# Patient Record
Sex: Male | Born: 2000 | Race: White | Hispanic: No | Marital: Single | Smoking: Never smoker
Health system: Southern US, Community
[De-identification: ages and names within clinical notes are randomized; demographics above are authoritative.]

## PROBLEM LIST (undated history)

## (undated) DIAGNOSIS — S62109A Fracture of unspecified carpal bone, unspecified wrist, initial encounter for closed fracture: Secondary | ICD-10-CM

## (undated) HISTORY — PX: APPENDECTOMY: SHX54

---
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
## 2020-02-04 IMAGING — CT CT ABD-PELV W/ CM
2 of 4 series · 15 of 46 positions shown, 17 images · IV contrast (APPLIED)
Comparison: None.

CLINICAL DATA: Mid abdominal pain since last evening

EXAM:
CT ABDOMEN AND PELVIS WITH CONTRAST
TECHNIQUE: Multidetector CT imaging of the abdomen and pelvis was performed
using the standard protocol following bolus administration of
intravenous contrast.
CONTRAST:  100mL [id number] IOPAMIDOL ([id number]) INJECTION 61%

[Series 2: axial st · axial · 0.56mm/px · z∈[-614,-194]mm · 12 of 96 slices shown, 14 images]
[im 8/96  soft-tissue]
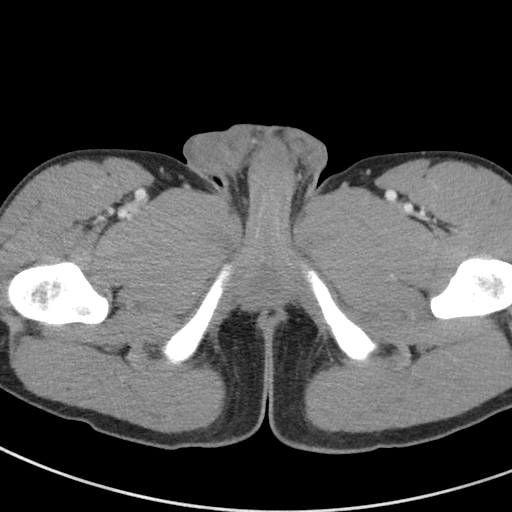
[im 8/96  bone]
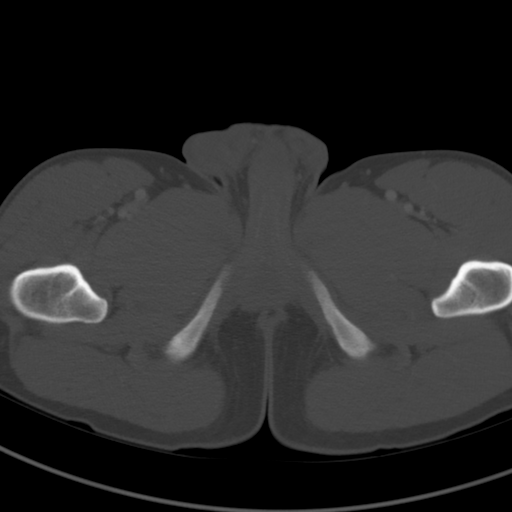
[im 16/96  soft-tissue]
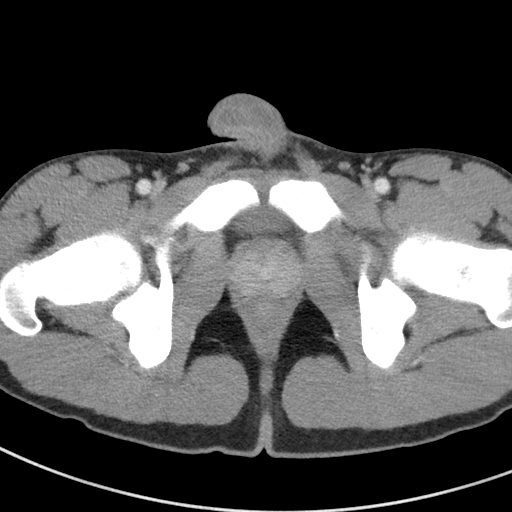
[im 23/96  soft-tissue]
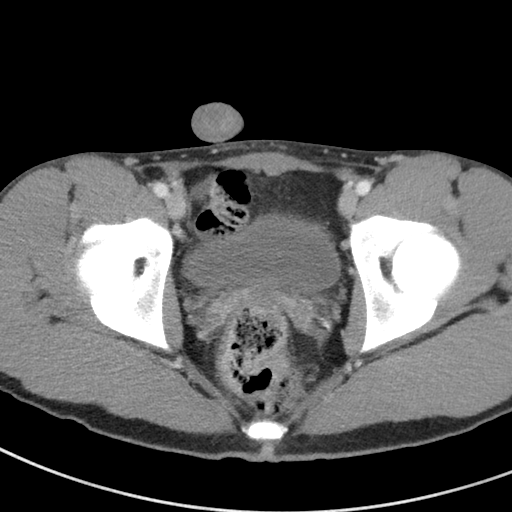
[im 31/96  soft-tissue]
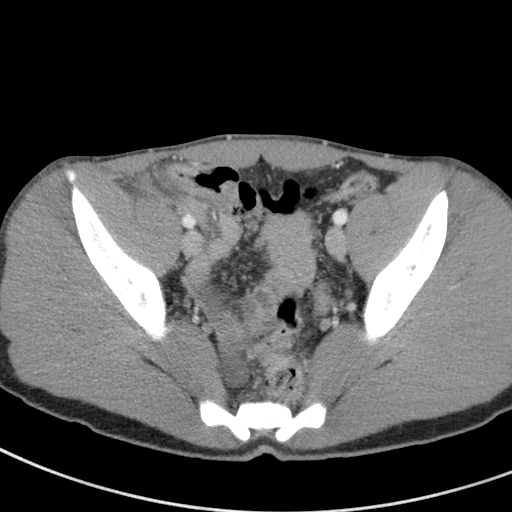
[im 39/96  soft-tissue]
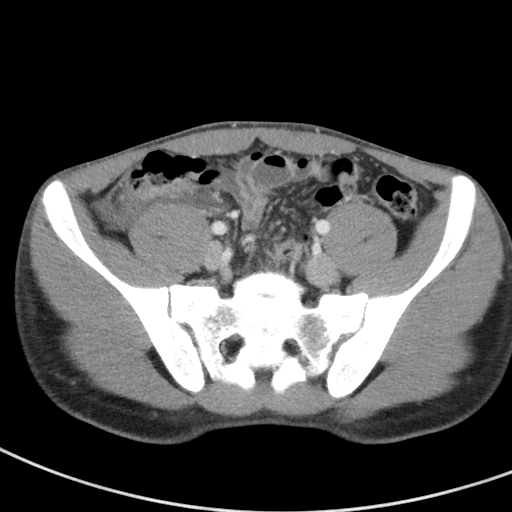
[im 46/96  soft-tissue]
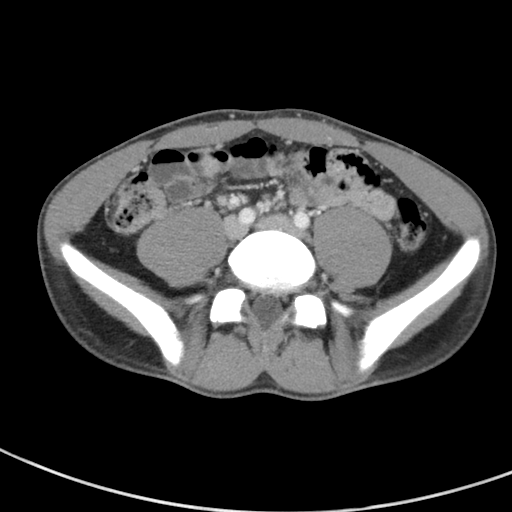
[im 54/96  soft-tissue]
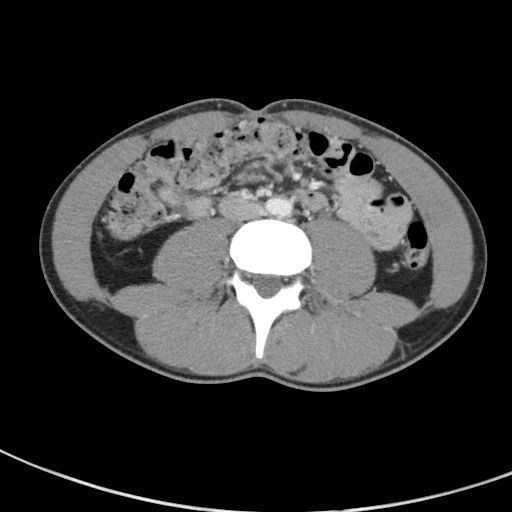
[im 61/96  soft-tissue]
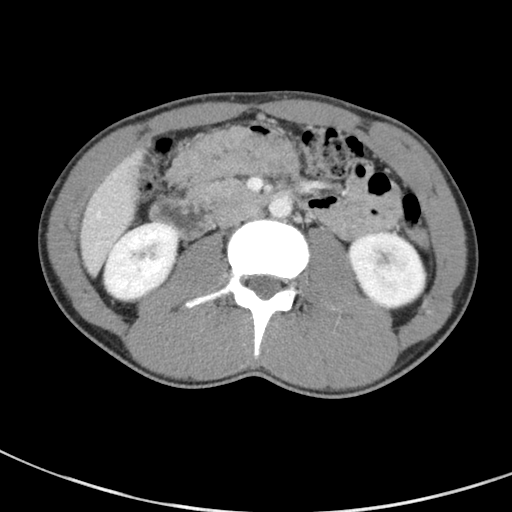
[im 69/96  soft-tissue]
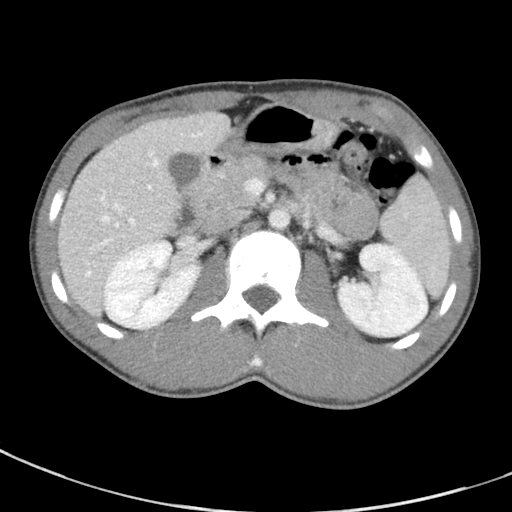
[im 69/96  bone]
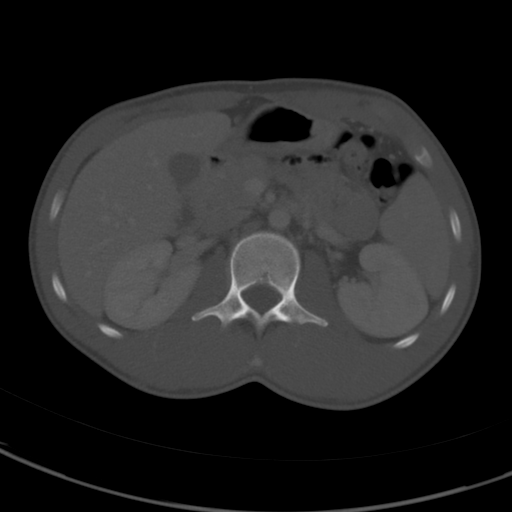
[im 77/96  soft-tissue]
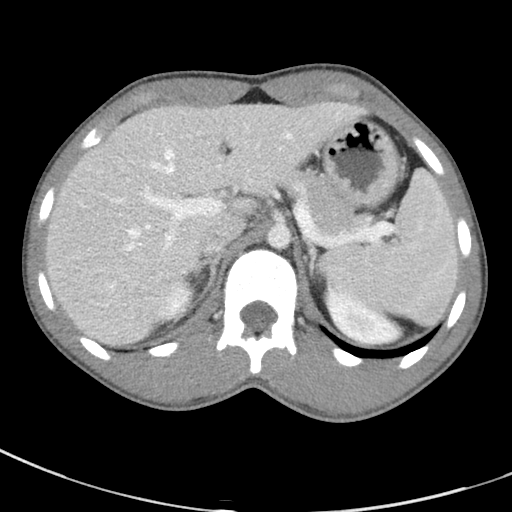
[im 84/96  soft-tissue]
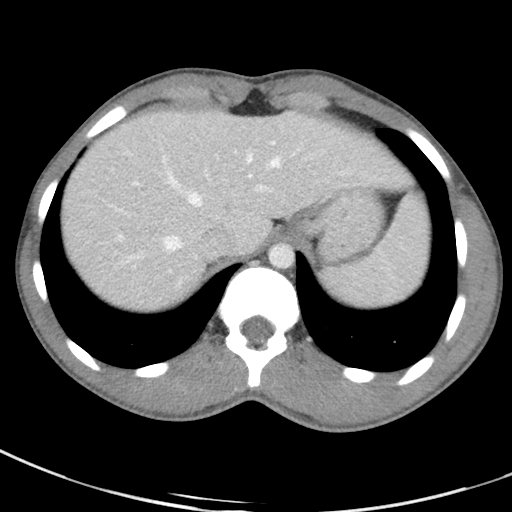
[im 92/96  soft-tissue]
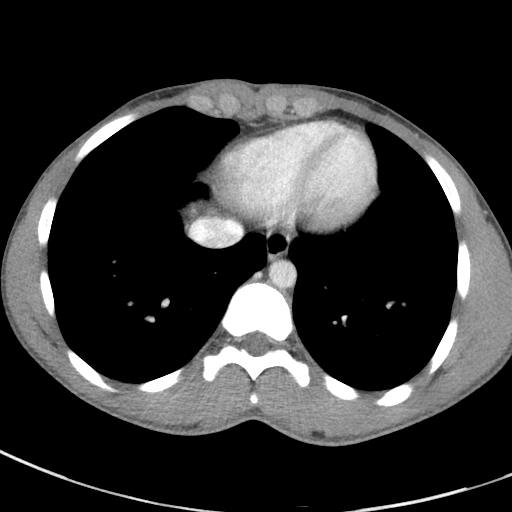

[Series 5: coronal st · coronal · 0.70mm/px · 3 of 73 slices shown]
[im 25/73  soft-tissue]
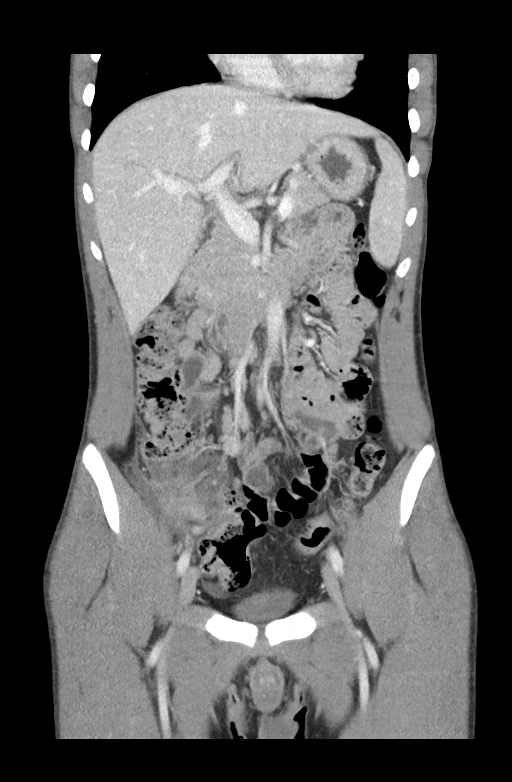
[im 33/73  soft-tissue]
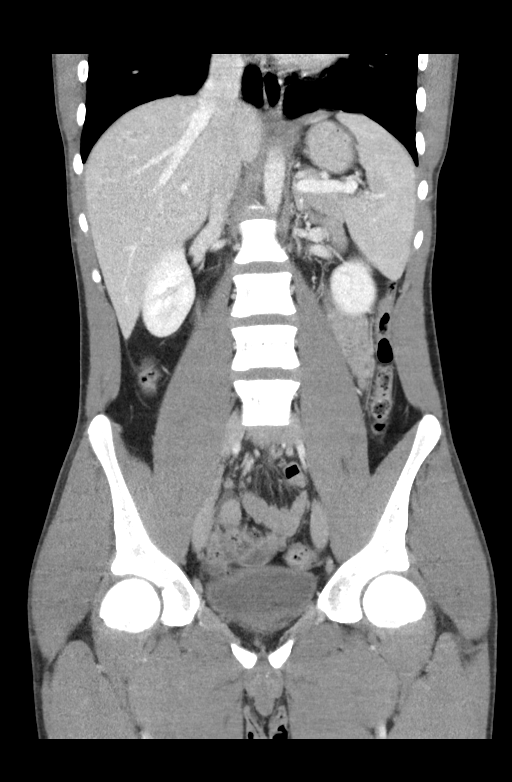
[im 41/73  soft-tissue]
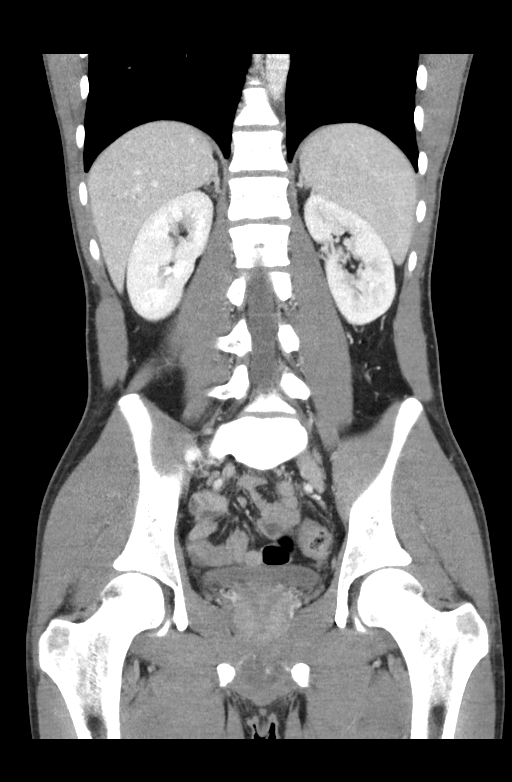

[15 of 46 positions shown; findings below may reference images not displayed]

FINDINGS: Lower chest: No acute abnormality.

Hepatobiliary: No focal liver abnormality is seen. No gallstones,
gallbladder wall thickening, or biliary dilatation.

Pancreas: Unremarkable. No pancreatic ductal dilatation or
surrounding inflammatory changes.

Spleen: Normal in size without focal abnormality.

Adrenals/Urinary Tract: Adrenal glands are unremarkable. Kidneys are
normal, without renal calculi, focal lesion, or hydronephrosis.
Bladder is unremarkable.

Stomach/Bowel: Abnormal appearance of the appendix which is as
follows:

Appendix: Location: McBurney's point

Diameter: 13 mm

Appendicolith: 10 x 4 mm, series [DATE]

Mucosal hyper-enhancement: Present

Extraluminal gas: None

Periappendiceal collection: Small amount of periappendiceal fluid
and mild to moderate periappendiceal inflammation without drainable
abscess

The remainder of the small large bowel are unremarkable.

Vascular/Lymphatic: No significant vascular findings are present. No
enlarged abdominal or pelvic lymph nodes.

Reproductive: Normal size prostate and seminal vesicles.

Other: Small amount of free fluid in the pelvis. No free air.

Musculoskeletal: No acute or significant osseous findings.
IMPRESSION: Acute appendicitis with periappendiceal inflammation, appendicolith
measuring 10 x 4 mm and small amount of free fluid in the right
lower quadrant and pelvis. No drainable abscess.

## 2021-06-03 ENCOUNTER — Other Ambulatory Visit: Payer: Self-pay

## 2021-06-03 ENCOUNTER — Emergency Department (HOSPITAL_BASED_OUTPATIENT_CLINIC_OR_DEPARTMENT_OTHER)
Admission: EM | Admit: 2021-06-03 | Discharge: 2021-06-03 | Disposition: A | Discharge status: Home / Self Care | Payer: Medicaid Other | Attending: Emergency Medicine | Admitting: Emergency Medicine

## 2021-06-03 ENCOUNTER — Encounter (HOSPITAL_BASED_OUTPATIENT_CLINIC_OR_DEPARTMENT_OTHER): Payer: Self-pay | Admitting: Emergency Medicine

## 2021-06-03 DIAGNOSIS — J029 Acute pharyngitis, unspecified: Secondary | ICD-10-CM | POA: Diagnosis present

## 2021-06-03 DIAGNOSIS — U071 COVID-19: Secondary | ICD-10-CM | POA: Diagnosis not present

## 2021-06-03 LAB — GROUP A STREP BY PCR: Group A Strep by PCR: NOT DETECTED

## 2021-06-03 NOTE — ED Triage Notes (Signed)
Pt arrives pov with c/o sore throat, generalized body aches, congestion and blood streaked mucus with vomitus last night. Pt denies abdominal pain. Pt endorses chills, unknown temp at home. Pt states "I feel like this every time I sleep under a fan, but Ive never thew up".

## 2021-06-03 NOTE — Discharge Instructions (Addendum)
Your strep test was negative today. We have tested you for COVID given complaints of a sore throat - we will call you if the results come back positive however you can log into MyChart and check the results that way as well.   Please follow up with your PCP regarding ED visit today.  Return to the ED for any new/worsening symptoms including persistent vomiting, black tarry stools, bright red blood in stools, abdominal pain, or any other new/worsening symptoms.

## 2021-06-03 NOTE — ED Provider Notes (Signed)
[facility] EMERGENCY DEPARTMENT Provider Note   CSN: [id number] Arrival date & time: 06/03/21  1212     History Chief Complaint  Patient presents with   Sore Throat    Dean Ochoa is a 20 y.o. male who presents to the ED today with complaint of gradual onset, constant, achy, sore throat that began yesterday morning upon waking up. Pt reports that he slept with a standing fan in his room Saturday night. He states he woke up yesterday morning with a sore throat and body aches which he states will happen to him whenever he sleeps with a fan blowing on him at nighttime. He forgot to turn the fan off before he went to bed. He states that all day yesterday he felt "bad" however last night he went to bed and then woke up in the middle of the night feeling nauseated and then had 1 episode of emesis. Pt reports a mix of food he ate that night ([place] food) and a small amount of blood tinged sputum. He states that he told his mom about it today and she advised that he come to the ED for further evaluation. Pt has been able to eat and drink since that episode without additional emesis. He denies any abdominal pain. No diarrhea. No melena. He is not anticoagulated. No other complaints at this time.   The history is provided by the patient and medical records.      Past Medical History:  Diagnosis Date   Wrist fracture     Patient Active Problem List   Diagnosis Date Noted   Appendicitis 10/11/2018   Acute appendicitis, uncomplicated 10/11/2018   ADHD (attention deficit hyperactivity disorder) 05/29/2011    Past Surgical History:  Procedure Laterality Date   APPENDECTOMY     LAPAROSCOPIC APPENDECTOMY N/A 10/11/2018   Procedure: APPENDECTOMY LAPAROSCOPIC PEDIATRIC;  Surgeon: Kandice Hams, MD;  Location: [facility] OR;  Service: Pediatrics;  Laterality: N/A;       History reviewed. No pertinent family history.  Social History   Tobacco Use   Smoking status: Never    Passive  exposure: Yes   Smokeless tobacco: Never   Tobacco comments:    parents smoke outside the home  Vaping Use   Vaping Use: Every day  Substance Use Topics   Alcohol use: Not Currently   Drug use: Yes    Types: Marijuana    Home Medications Prior to Admission medications   Medication Sig Start Date End Date Taking? Authorizing Provider  ibuprofen (ADVIL,MOTRIN) 600 MG tablet Take 1 tablet (600 mg total) by mouth every 8 (eight) hours as needed for up to 16 doses for mild pain. 10/12/18   Dozier-Lineberger, Mayah M, NP    Allergies    Penicillins  Review of Systems   Review of Systems  Constitutional:  Negative for chills and fever.  HENT:  Positive for sore throat. Negative for trouble swallowing and voice change.   Gastrointestinal:  Positive for vomiting (1 episode, resolved). Negative for abdominal pain, blood in stool, constipation and diarrhea.  Musculoskeletal:  Positive for myalgias.  All other systems reviewed and are negative.  Physical Exam Updated Vital Signs BP 125/71 (BP Location: Right Arm)   Pulse 67   Temp 98.3 F (36.8 C) (Oral)   Resp 18   Ht 5\' 9"  (1.753 m)   Wt 59 kg   SpO2 100%   BMI 19.20 kg/m   Physical Exam Vitals and nursing note reviewed.  Constitutional:  Appearance: He is not ill-appearing or diaphoretic.  HENT:     Head: Normocephalic and atraumatic.     Nose: No congestion.     Mouth/Throat:     Pharynx: Uvula midline. Pharyngeal swelling and posterior oropharyngeal erythema present.  Eyes:     Conjunctiva/sclera: Conjunctivae normal.  Cardiovascular:     Rate and Rhythm: Normal rate and regular rhythm.     Heart sounds: Normal heart sounds.  Pulmonary:     Effort: Pulmonary effort is normal.     Breath sounds: Normal breath sounds. No wheezing, rhonchi or rales.  Abdominal:     Palpations: Abdomen is soft.     Tenderness: There is no abdominal tenderness. There is no guarding or rebound.  Skin:    General: Skin is warm and  dry.     Coloration: Skin is not jaundiced.  Neurological:     Mental Status: He is alert.    ED Results / Procedures / Treatments   Labs (all labs ordered are listed, but only abnormal results are displayed) Labs Reviewed  GROUP A STREP BY PCR  SARS CORONAVIRUS 2 (TAT 6-24 HRS)    EKG None  Radiology No results found.  Procedures Procedures   Medications Ordered in ED Medications - No data to display  ED Course  I have reviewed the triage vital signs and the nursing notes.  Pertinent labs & imaging results that were available during my care of the patient were reviewed by me and considered in my medical decision making (see chart for details).    MDM Rules/Calculators/A&P                          20 year old male who presents to the ED today with complaint of sore throat and body aches that began yesterday morning after waking up.  He attributes this to sleeping with his standing fan on.  He does report that last night after going to sleep he woke up and had 1 episode of blood-tinged emesis.  He denies gross blood.  States that it was a mix of his [place] food that he ate for dinner as well as a small amount of blood-tinged sputum.  He has been able to eat and drink since then, denies any abdominal pain.  On arrival to the ED today patient is afebrile, nontachycardic and nontachypneic and appears to be in no acute distress.  He has no abdominal tenderness palpation on my exam.  He denies any melena or bright red blood per rectum.  Given 1 episode of blood-tinged sputum do not feel he requires additional work-up for same at this time.  His abdomen is soft, doubt acute abdomen today.  Suspect likely Mallory-Weiss tear from retching.  He is noted to have some posterior oropharyngeal erythema however with edema and given complaint of sore throat we will plan to test for strep as well as COVID.  The question of strep could have caused some GI upset for patient yesterday.   Strep  negative. Pt to be discharged home with instructions to self isolate pending COVID results. He should follow up with his PCP as well regarding ED visit. Encouraged to return for any new/worsening symptoms. Stable for discharge.   This note was prepared using Dragon voice recognition software and may include unintentional dictation errors due to the inherent limitations of voice recognition software.   Final Clinical Impression(s) / ED Diagnoses Final diagnoses:  Sore throat    Rx /  DC Orders ED Discharge Orders     None        Discharge Instructions      Your strep test was negative today. We have tested you for COVID given complaints of a sore throat - we will call you if the results come back positive however you can log into MyChart and check the results that way as well.   Please follow up with your PCP regarding ED visit today.  Return to the ED for any new/worsening symptoms including persistent vomiting, black tarry stools, bright red blood in stools, abdominal pain, or any other new/worsening symptoms.       Tanda Rockers, PA-C 06/03/21 1455    Terrilee Files, MD 06/03/21 1810

## 2021-06-03 NOTE — ED Notes (Signed)
ED Provider at bedside. PA at bedside to speak to pt's mother prior to discharge

## 2021-06-04 LAB — SARS CORONAVIRUS 2 (TAT 6-24 HRS): SARS Coronavirus 2: POSITIVE — AB
